# Patient Record
Sex: Male | Born: 1985 | Race: Black or African American | Hispanic: No | Marital: Single | Smoking: Never smoker
Health system: Southern US, Community
[De-identification: ages and names within clinical notes are randomized; demographics above are authoritative.]

---
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
## 2013-02-15 ENCOUNTER — Emergency Department (HOSPITAL_COMMUNITY)
Admission: EM | Admit: 2013-02-15 | Discharge: 2013-02-15 | Disposition: A | Discharge status: Home / Self Care | Payer: Self-pay | Attending: Emergency Medicine | Admitting: Emergency Medicine

## 2013-02-15 ENCOUNTER — Encounter (HOSPITAL_COMMUNITY): Payer: Self-pay | Admitting: Emergency Medicine

## 2013-02-15 DIAGNOSIS — Y9289 Other specified places as the place of occurrence of the external cause: Secondary | ICD-10-CM | POA: Insufficient documentation

## 2013-02-15 DIAGNOSIS — S61409A Unspecified open wound of unspecified hand, initial encounter: Secondary | ICD-10-CM | POA: Insufficient documentation

## 2013-02-15 DIAGNOSIS — S61411A Laceration without foreign body of right hand, initial encounter: Secondary | ICD-10-CM

## 2013-02-15 DIAGNOSIS — Y990 Civilian activity done for income or pay: Secondary | ICD-10-CM | POA: Insufficient documentation

## 2013-02-15 DIAGNOSIS — Y9389 Activity, other specified: Secondary | ICD-10-CM | POA: Insufficient documentation

## 2013-02-15 DIAGNOSIS — Z791 Long term (current) use of non-steroidal anti-inflammatories (NSAID): Secondary | ICD-10-CM | POA: Insufficient documentation

## 2013-02-15 DIAGNOSIS — W268XXA Contact with other sharp object(s), not elsewhere classified, initial encounter: Secondary | ICD-10-CM | POA: Insufficient documentation

## 2013-02-15 MED ORDER — IBUPROFEN 800 MG PO TABS: 800.0000 mg | ORAL_TABLET | Freq: Three times a day (TID) | ORAL | Status: DC

## 2013-02-15 NOTE — ED Notes (Signed)
4x4s placed between skin laceration and rag patient came in with. Rag still wrapped out hand so pressure is continually applied.

## 2013-02-15 NOTE — ED Notes (Signed)
Pt c/o right hand laceration while at work; bleeding controlled

## 2013-02-15 NOTE — ED Provider Notes (Signed)
CSN: [id number]     Arrival date & time 02/15/13  1355 History  This chart was scribed for non-physician practitioner, Dierdre Forth, PA-C working with No att. providers found by Greggory Stallion, ED scribe. This patient was seen in room TR07C/TR07C and the patient's care was started at 3:13 PM.   Chief Complaint  Patient presents with  . Laceration   The history is provided by the patient. No language interpreter was used.   HPI Comments: Harold Dixon is a 27 y.o. male who presents to the Emergency Department complaining of an acute, persistent laceration to the palm of his his right hand that occurred around 1:45 PM today. Pt states he cut it on apiece of metal on a car while he was at work. He states he has sudden onset, mild right hand pain. Pt rates his pain 4/10. His last tetanus was in 2011.   History reviewed. No pertinent past medical history. History reviewed. No pertinent past surgical history. History reviewed. No pertinent family history. History  Substance Use Topics  . Smoking status: Never Smoker   . Smokeless tobacco: Not on file  . Alcohol Use: No    Review of Systems  Constitutional: Negative for fever.  Gastrointestinal: Negative for nausea and vomiting.  Skin: Positive for wound.  Allergic/Immunologic: Negative for immunocompromised state.  Neurological: Negative for weakness and numbness.  Hematological: Does not bruise/bleed easily.  Psychiatric/Behavioral: The patient is not nervous/anxious.     Allergies  Review of patient's allergies indicates no known allergies.  Home Medications   Current Outpatient Rx  Name  Route  Sig  Dispense  Refill  . ibuprofen (ADVIL,MOTRIN) 800 MG tablet   Oral   Take 1 tablet (800 mg total) by mouth 3 (three) times daily.   21 tablet   0     BP 133/90  Pulse 102  Temp(Src) 97.7 F (36.5 C) (Oral)  Resp 18  SpO2 97%  Physical Exam  Nursing note and vitals reviewed. Constitutional: He is oriented to  person, place, and time. He appears well-developed and well-nourished. No distress.  HENT:  Head: Normocephalic and atraumatic.  Eyes: Conjunctivae are normal. No scleral icterus.  Neck: Normal range of motion.  Cardiovascular: Normal rate, regular rhythm, normal heart sounds and intact distal pulses.   No murmur heard. Capillary refill < 3 sec  Pulmonary/Chest: Effort normal and breath sounds normal. No respiratory distress.  Musculoskeletal: Normal range of motion. He exhibits no edema.  ROM: Full ROM of all fingers and wrist.   Neurological: He is alert and oriented to person, place, and time.  Sensation: intact to dull and sharp Strength: 5/5 including resisted flexion and extension of the fingers and thumb.   Skin: Skin is warm and dry. He is not diaphoretic. No erythema.  9 cm laceration to palm of right hand.   Psychiatric: He has a normal mood and affect. His behavior is normal.    ED Course  Procedures (including critical care time)  DIAGNOSTIC STUDIES: Oxygen Saturation is 97% on RA, normal by my interpretation.    COORDINATION OF CARE: 3:14 PM-Discussed treatment plan which includes laceration repair with pt at bedside and pt agreed to plan.   LACERATION REPAIR PROCEDURE NOTE The patient's identification was confirmed and consent was obtained. This procedure was performed by Dierdre Forth, PA-C at 3:34 PM. Site: palm of right hand Sterile procedures observed Anesthetic used (type and amt): 10 mL 2% lidocaine without epi Suture type/size: Prolene 5-0 Length: 9  cm # of Sutures: 11 Technique: simple interrupted Complexity: simple Antibx ointment applied Tetanus UTD Site anesthetized, irrigated with NS, explored without evidence of foreign body, wound well approximated, site covered with dry, sterile dressing.  Patient tolerated procedure well without complications. Instructions for care discussed verbally and patient provided with additional written  instructions for homecare and f/u.  Labs Review Labs Reviewed - No data to display Imaging Review No results found.  EKG Interpretation   None       MDM   1. Laceration of right hand, initial encounter      Harold Dixon presents with hand laceration.  Tdap UTD.  Pressure irrigation performed. Laceration occurred < 8 hours prior to repair which was well tolerated. Pt has no co morbidities to effect normal wound healing. Discussed suture home care w pt and answered questions. Pt to f-u for wound check and suture removal in 7 days. Pt is hemodynamically stable w no complaints prior to dc. It has been determined that no acute conditions requiring further emergency intervention are present at this time. The patient/guardian have been advised of the diagnosis and plan. We have discussed signs and symptoms that warrant return to the ED, such as changes or worsening in symptoms.   Vital signs are stable at discharge.   BP 90/57  Pulse 72  Temp(Src) 97.7 F (36.5 C) (Oral)  Resp 18  SpO2 95%  Patient/guardian has voiced understanding and agreed to follow-up with the PCP or specialist.    I personally performed the services described in this documentation, which was scribed in my presence. The recorded information has been reviewed and is accurate.      Dahlia Client Ameisha Mcclellan, PA-C 02/15/13 1640

## 2013-02-15 NOTE — ED Provider Notes (Signed)
  Medical screening examination/treatment/procedure(s) were performed by non-physician practitioner and as supervising physician I was immediately available for consultation/collaboration.   Contina Strain, MD 02/15/13 2208 

## 2015-03-04 ENCOUNTER — Emergency Department
Admission: EM | Admit: 2015-03-04 | Discharge: 2015-03-04 | Disposition: A | Discharge status: Home / Self Care | Payer: Self-pay | Attending: Emergency Medicine | Admitting: Emergency Medicine

## 2015-03-04 ENCOUNTER — Encounter: Payer: Self-pay | Admitting: *Deleted

## 2015-03-04 ENCOUNTER — Emergency Department: Payer: Self-pay

## 2015-03-04 DIAGNOSIS — Y92410 Unspecified street and highway as the place of occurrence of the external cause: Secondary | ICD-10-CM | POA: Insufficient documentation

## 2015-03-04 DIAGNOSIS — Y9389 Activity, other specified: Secondary | ICD-10-CM | POA: Insufficient documentation

## 2015-03-04 DIAGNOSIS — S93401A Sprain of unspecified ligament of right ankle, initial encounter: Secondary | ICD-10-CM | POA: Insufficient documentation

## 2015-03-04 DIAGNOSIS — S39012A Strain of muscle, fascia and tendon of lower back, initial encounter: Secondary | ICD-10-CM | POA: Insufficient documentation

## 2015-03-04 DIAGNOSIS — Z791 Long term (current) use of non-steroidal anti-inflammatories (NSAID): Secondary | ICD-10-CM | POA: Insufficient documentation

## 2015-03-04 DIAGNOSIS — M62830 Muscle spasm of back: Secondary | ICD-10-CM | POA: Insufficient documentation

## 2015-03-04 DIAGNOSIS — M25571 Pain in right ankle and joints of right foot: Secondary | ICD-10-CM

## 2015-03-04 DIAGNOSIS — Y998 Other external cause status: Secondary | ICD-10-CM | POA: Insufficient documentation

## 2015-03-04 DIAGNOSIS — S40011A Contusion of right shoulder, initial encounter: Secondary | ICD-10-CM | POA: Insufficient documentation

## 2015-03-04 MED ORDER — CYCLOBENZAPRINE HCL 10 MG PO TABS: 10.0000 mg | ORAL_TABLET | Freq: Three times a day (TID) | ORAL | Status: DC | PRN

## 2015-03-04 MED ORDER — MELOXICAM 15 MG PO TABS: 15.0000 mg | ORAL_TABLET | Freq: Every day | ORAL | Status: DC
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
## 2015-03-04 NOTE — Discharge Instructions (Signed)
Ankle Pain Ankle pain is a common symptom. The bones, cartilage, tendons, and muscles of the ankle joint perform a lot of work each day. The ankle joint holds your body weight and allows you to move around. Ankle pain can occur on either side or back of 1 or both ankles. Ankle pain may be sharp and burning or dull and aching. There may be tenderness, stiffness, redness, or warmth around the ankle. The pain occurs more often when a person walks or puts pressure on the ankle. CAUSES  There are many reasons ankle pain can develop. It is important to work with your caregiver to identify the cause since many conditions can impact the bones, cartilage, muscles, and tendons. Causes for ankle pain include:  Injury, including a break (fracture), sprain, or strain often due to a fall, sports, or a high-impact activity.  Swelling (inflammation) of a tendon (tendonitis).  Achilles tendon rupture.  Ankle instability after repeated sprains and strains.  Poor foot alignment.  Pressure on a nerve (tarsal tunnel syndrome).  Arthritis in the ankle or the lining of the ankle.  Crystal formation in the ankle (gout or pseudogout). DIAGNOSIS  A diagnosis is based on your medical history, your symptoms, results of your physical exam, and results of diagnostic tests. Diagnostic tests may include X-ray exams or a computerized magnetic scan (magnetic resonance imaging, MRI). TREATMENT  Treatment will depend on the cause of your ankle pain and may include:  Keeping pressure off the ankle and limiting activities.  Using crutches or other walking support (a cane or brace).  Using rest, ice, compression, and elevation.  Participating in physical therapy or home exercises.  Wearing shoe inserts or special shoes.  Losing weight.  Taking medications to reduce pain or swelling or receiving an injection.  Undergoing surgery. HOME CARE INSTRUCTIONS   Only take over-the-counter or prescription medicines for  pain, discomfort, or fever as directed by your caregiver.  Put ice on the injured area.  Put ice in a plastic bag.  Place a towel between your skin and the bag.  Leave the ice on for 15-20 minutes at a time, 03-04 times a day.  Keep your leg raised (elevated) when possible to lessen swelling.  Avoid activities that cause ankle pain.  Follow specific exercises as directed by your caregiver.  Record how often you have ankle pain, the location of the pain, and what it feels like. This information may be helpful to you and your caregiver.  Ask your caregiver about returning to work or sports and whether you should drive.  Follow up with your caregiver for further examination, therapy, or testing as directed. SEEK MEDICAL CARE IF:   Pain or swelling continues or worsens beyond 1 week.  You have an oral temperature above 102 F (38.9 C).  You are feeling unwell or have chills.  You are having an increasingly difficult time with walking.  You have loss of sensation or other new symptoms.  You have questions or concerns. MAKE SURE YOU:   Understand these instructions.  Will watch your condition.  Will get help right away if you are not doing well or get worse.   This information is not intended to replace advice given to you by your health care provider. Make sure you discuss any questions you have with your health care provider.   Document Released: 10/05/2009 Document Revised: 07/10/2011 Document Reviewed: 11/17/2014 Elsevier Interactive Patient Education 2016 [employer]  Contusion A contusion is a deep bruise. Contusions  are the result of a blunt injury to tissues and muscle fibers under the skin. The injury causes bleeding under the skin. The skin overlying the contusion may turn blue, purple, or yellow. Minor injuries will give you a painless contusion, but more severe contusions may stay painful and swollen for a few weeks.  CAUSES  This condition is usually  caused by a blow, trauma, or direct force to an area of the body. SYMPTOMS  Symptoms of this condition include:  Swelling of the injured area.  Pain and tenderness in the injured area.  Discoloration. The area may have redness and then turn blue, purple, or yellow. DIAGNOSIS  This condition is diagnosed based on a physical exam and medical history. An X-ray, CT scan, or MRI may be needed to determine if there are any associated injuries, such as broken bones (fractures). TREATMENT  Specific treatment for this condition depends on what area of the body was injured. In general, the best treatment for a contusion is resting, icing, applying pressure to (compression), and elevating the injured area. This is often called the RICE strategy. Over-the-counter anti-inflammatory medicines may also be recommended for pain control.  HOME CARE INSTRUCTIONS   Rest the injured area.  If directed, apply ice to the injured area:  Put ice in a plastic bag.  Place a towel between your skin and the bag.  Leave the ice on for 20 minutes, 2-3 times per day.  If directed, apply light compression to the injured area using an elastic bandage. Make sure the bandage is not wrapped too tightly. Remove and reapply the bandage as directed by your health care provider.  If possible, raise (elevate) the injured area above the level of your heart while you are sitting or lying down.  Take over-the-counter and prescription medicines only as told by your health care provider. SEEK MEDICAL CARE IF:  Your symptoms do not improve after several days of treatment.  Your symptoms get worse.  You have difficulty moving the injured area. SEEK IMMEDIATE MEDICAL CARE IF:   You have severe pain.  You have numbness in a hand or foot.  Your hand or foot turns pale or cold.   This information is not intended to replace advice given to you by your health care provider. Make sure you discuss any questions you have with  your health care provider.   Document Released: 01/25/2005 Document Revised: 01/06/2015 Document Reviewed: 09/02/2014 Elsevier Interactive Patient Education 2016 [employer]  Stirrup Ankle Brace Stirrup ankle braces give support and help stabilize the ankle joint. They are rigid pieces of plastic or fiberglass that go up both sides of the lower leg with the bottom of the stirrup fitting comfortably under the bottom of the instep of the foot. It can be held on with Velcro straps or an elastic wrap. Stirrup ankle braces are used to support the ankle following mild or moderate sprains or strains, or fractures after cast removal.  They can be easily removed or adjusted if there is swelling. The rigid brace shells are designed to fit the ankle comfortably and provide the needed medial/lateral stabilization. This brace can be easily worn with most athletic shoes. The brace liner is usually made of a soft, comfortable gel-like material. This gel fits the ankle well without causing uncomfortable pressure points.  IMPORTANCE OF ANKLE BRACES:  The use of ankle bracing is effective in the prevention of ankle sprains.  In athletes, the use of ankle bracing will offer protection and  prevent further sprains.  Research shows that a complete rehabilitation program needs to be included with external bracing. This includes range of motion and ankle strengthening exercises. Your caregivers will instruct you in this. If you were given the brace today for a new injury, use the following home care instructions as a guide. HOME CARE INSTRUCTIONS   Apply ice to the sore area for 15-20 minutes, 03-04 times per day while awake for the first 2 days. Put the ice in a plastic bag and place a towel between the bag of ice and your skin. Never place the ice pack directly on your skin. Be especially careful using ice on an elbow or knee or other bony area, such as your ankle, because icing for too long may damage the nerves  which are close to the surface.  Keep your leg elevated when possible to lessen swelling.  Wear your splint until you are seen for a follow-up examination. Do not put weight on it. Do not get it wet. You may take it off to take a shower or bath.  For Activity: Use crutches with non-weight bearing for 1 week. Then, you may walk on your ankle as instructed. Start gradually with weight bearing on the affected ankle.  Continue to use crutches or a cane until you can stand on your ankle without causing pain.  Wiggle your toes in the splint several times per day if you are able.  The splint is too tight if you have numbness, tingling, or if your foot becomes cold and blue. Adjust the straps or elastic bandage to make it comfortable.  Only take over-the-counter or prescription medicines for pain, discomfort, or fever as directed by your caregiver. SEEK IMMEDIATE MEDICAL CARE IF:   You have increased bruising, swelling or pain.  Your toes are blue or cold and loosening the brace or wrap does not help.  Your pain is not relieved with medicine. MAKE SURE YOU:   Understand these instructions.  Will watch your condition.  Will get help right away if you are not doing well or get worse.   This information is not intended to replace advice given to you by your health care provider. Make sure you discuss any questions you have with your health care provider.   Document Released: 02/16/2004 Document Revised: 07/10/2011 Document Reviewed: 11/17/2014 Elsevier Interactive Patient Education 2016 [employer]  Muscle Strain A muscle strain is an injury that occurs when a muscle is stretched beyond its normal length. Usually a small number of muscle fibers are torn when this happens. Muscle strain is rated in degrees. First-degree strains have the least amount of muscle fiber tearing and pain. Second-degree and third-degree strains have increasingly more tearing and pain.  Usually, recovery from  muscle strain takes 1-2 weeks. Complete healing takes 5-6 weeks.  CAUSES  Muscle strain happens when a sudden, violent force placed on a muscle stretches it too far. This may occur with lifting, sports, or a fall.  RISK FACTORS Muscle strain is especially common in athletes.  SIGNS AND SYMPTOMS At the site of the muscle strain, there may be:  Pain.  Bruising.  Swelling.  Difficulty using the muscle due to pain or lack of normal function. DIAGNOSIS  Your health care provider will perform a physical exam and ask about your medical history. TREATMENT  Often, the best treatment for a muscle strain is resting, icing, and applying cold compresses to the injured area.  HOME CARE INSTRUCTIONS   Use the  PRICE method of treatment to promote muscle healing during the first 2-3 days after your injury. The PRICE method involves:  Protecting the muscle from being injured again.  Restricting your activity and resting the injured body part.  Icing your injury. To do this, put ice in a plastic bag. Place a towel between your skin and the bag. Then, apply the ice and leave it on from 15-20 minutes each hour. After the third day, switch to moist heat packs.  Apply compression to the injured area with a splint or elastic bandage. Be careful not to wrap it too tightly. This may interfere with blood circulation or increase swelling.  Elevate the injured body part above the level of your heart as often as you can.  Only take over-the-counter or prescription medicines for pain, discomfort, or fever as directed by your health care provider.  Warming up prior to exercise helps to prevent future muscle strains. SEEK MEDICAL CARE IF:   You have increasing pain or swelling in the injured area.  You have numbness, tingling, or a significant loss of strength in the injured area. MAKE SURE YOU:   Understand these instructions.  Will watch your condition.  Will get help right away if you are not doing  well or get worse.   This information is not intended to replace advice given to you by your health care provider. Make sure you discuss any questions you have with your health care provider.   Document Released: 04/17/2005 Document Revised: 02/05/2013 Document Reviewed: 11/14/2012 Elsevier Interactive Patient Education [employer] Else[employer] Inc.

## 2018-10-02 ENCOUNTER — Encounter (HOSPITAL_COMMUNITY): Payer: Self-pay

## 2018-10-02 ENCOUNTER — Other Ambulatory Visit: Payer: Self-pay

## 2018-10-02 ENCOUNTER — Inpatient Hospital Stay (HOSPITAL_COMMUNITY)
Admission: EM | Admit: 2018-10-02 | Discharge: 2018-10-03 | DRG: 178 | Disposition: A | Discharge status: Home / Self Care | Payer: HRSA Program | Attending: Internal Medicine | Admitting: Internal Medicine

## 2018-10-02 ENCOUNTER — Emergency Department (HOSPITAL_COMMUNITY): Payer: HRSA Program

## 2018-10-02 DIAGNOSIS — J989 Respiratory disorder, unspecified: Secondary | ICD-10-CM

## 2018-10-02 DIAGNOSIS — E860 Dehydration: Secondary | ICD-10-CM | POA: Diagnosis present

## 2018-10-02 DIAGNOSIS — U071 COVID-19: Principal | ICD-10-CM | POA: Diagnosis present

## 2018-10-02 DIAGNOSIS — N179 Acute kidney failure, unspecified: Secondary | ICD-10-CM | POA: Diagnosis present

## 2018-10-02 DIAGNOSIS — R509 Fever, unspecified: Secondary | ICD-10-CM

## 2018-10-02 DIAGNOSIS — D6959 Other secondary thrombocytopenia: Secondary | ICD-10-CM | POA: Diagnosis not present

## 2018-10-02 DIAGNOSIS — Z79899 Other long term (current) drug therapy: Secondary | ICD-10-CM

## 2018-10-02 DIAGNOSIS — I959 Hypotension, unspecified: Secondary | ICD-10-CM | POA: Diagnosis present

## 2018-10-02 DIAGNOSIS — R001 Bradycardia, unspecified: Secondary | ICD-10-CM | POA: Diagnosis not present

## 2018-10-02 LAB — CBC WITH DIFFERENTIAL/PLATELET
Abs Immature Granulocytes: 0.01 10*3/uL (ref 0.00–0.07)
Basophils Absolute: 0 10*3/uL (ref 0.0–0.1)
Basophils Relative: 0 %
Eosinophils Absolute: 0 10*3/uL (ref 0.0–0.5)
Eosinophils Relative: 0 %
HCT: 46.7 % (ref 39.0–52.0)
Hemoglobin: 16.2 g/dL (ref 13.0–17.0)
Immature Granulocytes: 0 %
Lymphocytes Relative: 12 %
Lymphs Abs: 0.8 10*3/uL (ref 0.7–4.0)
MCH: 31.2 pg (ref 26.0–34.0)
MCHC: 34.7 g/dL (ref 30.0–36.0)
MCV: 89.8 fL (ref 80.0–100.0)
Monocytes Absolute: 0.5 10*3/uL (ref 0.1–1.0)
Monocytes Relative: 7 %
Neutro Abs: 5.5 10*3/uL (ref 1.7–7.7)
Neutrophils Relative %: 81 %
Platelets: 119 10*3/uL — ABNORMAL LOW (ref 150–400)
RBC: 5.2 MIL/uL (ref 4.22–5.81)
RDW: 13.1 % (ref 11.5–15.5)
WBC: 6.8 10*3/uL (ref 4.0–10.5)
nRBC: 0 % (ref 0.0–0.2)

## 2018-10-02 LAB — BASIC METABOLIC PANEL
Anion gap: 12 (ref 5–15)
BUN: 14 mg/dL (ref 6–20)
CO2: 27 mmol/L (ref 22–32)
Calcium: 9.7 mg/dL (ref 8.9–10.3)
Chloride: 99 mmol/L (ref 98–111)
Creatinine, Ser: 1.73 mg/dL — ABNORMAL HIGH (ref 0.61–1.24)
GFR calc Af Amer: 59 mL/min — ABNORMAL LOW (ref >60)
GFR calc non Af Amer: 51 mL/min — ABNORMAL LOW (ref >60)
Glucose, Bld: 119 mg/dL — ABNORMAL HIGH (ref 70–99)
Potassium: 4.5 mmol/L (ref 3.5–5.1)
Sodium: 138 mmol/L (ref 135–145)

## 2018-10-02 LAB — SARS CORONAVIRUS 2 BY RT PCR (HOSPITAL ORDER, PERFORMED IN ~~LOC~~ HOSPITAL LAB): SARS Coronavirus 2: POSITIVE — AB

## 2018-10-02 MED ORDER — KETOROLAC TROMETHAMINE 60 MG/2ML IM SOLN
30.0000 mg | Freq: Once | INTRAMUSCULAR | Status: AC
  Administered 2018-10-02: 30 mg via INTRAMUSCULAR
  Filled 2018-10-02: qty 2

## 2018-10-02 MED ORDER — BENZONATATE 100 MG PO CAPS: 100.0000 mg | ORAL_CAPSULE | Freq: Three times a day (TID) | ORAL | 0 refills | Status: AC

## 2018-10-02 NOTE — ED Notes (Signed)
Pt not having ear pain

## 2018-10-02 NOTE — ED Provider Notes (Signed)
[facility] EMERGENCY DEPARTMENT Provider Note   CSN: [id number] Arrival date & time: 10/02/18  1717    History   Chief Complaint No chief complaint on file.   HPI Harold Dixon is a 33 y.o. male.     Patient presents today with history of intermittent fever and chills for 5 days. Fever has been responsive to antipyretics. Mild non-productive cough onset today, along with occasional nausea. One episode of emesis after arriving in ED. No known sick contacts. Denies shortness of breath, abdominal pain, diarrhea.  The history is provided by the patient. No language interpreter was used.  Fever  Max temp prior to arrival:  102 Onset quality:  Sudden Duration:  5 days Timing:  Intermittent Progression:  Waxing and waning Chronicity:  New Associated symptoms: chills, cough, myalgias, nausea and vomiting   Associated symptoms: no chest pain, no diarrhea and no headaches     History reviewed. No pertinent past medical history.  There are no active problems to display for this patient.   History reviewed. No pertinent surgical history.      Home Medications    Prior to Admission medications   Medication Sig Start Date End Date Taking? Authorizing Provider  cyclobenzaprine (FLEXERIL) 10 MG tablet Take 1 tablet (10 mg total) by mouth 3 (three) times daily as needed for muscle spasms. 03/04/15   Cuthriell, Delorise Royals, PA-C  ibuprofen (ADVIL,MOTRIN) 800 MG tablet Take 1 tablet (800 mg total) by mouth 3 (three) times daily. 02/15/13   Muthersbaugh, Dahlia Client, PA-C  meloxicam (MOBIC) 15 MG tablet Take 1 tablet (15 mg total) by mouth daily. 03/04/15   Cuthriell, Delorise Royals, PA-C    Family History History reviewed. No pertinent family history.  Social History Social History   Tobacco Use  . Smoking status: Never Smoker  Substance Use Topics  . Alcohol use: No  . Drug use: No     Allergies   Patient has no known allergies.   Review of Systems  Review of Systems  Constitutional: Positive for chills and fever.  Respiratory: Positive for cough. Negative for shortness of breath.   Cardiovascular: Negative for chest pain.  Gastrointestinal: Positive for nausea and vomiting. Negative for abdominal pain and diarrhea.  Musculoskeletal: Positive for myalgias.  Neurological: Negative for headaches.  All other systems reviewed and are negative.    Physical Exam Updated Vital Signs BP 122/70 (BP Location: Right Arm)   Pulse (!) 102   Temp (!) 102.8 F (39.3 C) (Oral)   Resp 18   SpO2 96%   Physical Exam Vitals signs and nursing note reviewed.  Constitutional:      General: He is not in acute distress. HENT:     Head: Normocephalic.     Mouth/Throat:     Mouth: Mucous membranes are moist.  Eyes:     Conjunctiva/sclera: Conjunctivae normal.  Neck:     Musculoskeletal: Neck supple.  Cardiovascular:     Rate and Rhythm: Normal rate and regular rhythm.  Pulmonary:     Effort: Pulmonary effort is normal.     Breath sounds: Normal breath sounds.  Abdominal:     Palpations: Abdomen is soft.  Musculoskeletal: Normal range of motion.  Skin:    General: Skin is warm and dry.  Neurological:     Mental Status: He is alert and oriented to person, place, and time.  Psychiatric:        Mood and Affect: Mood normal.  ED Treatments / Results  Labs (all labs ordered are listed, but only abnormal results are displayed) Labs Reviewed  SARS CORONAVIRUS 2 (HOSPITAL ORDER, PERFORMED IN [facility]) - Abnormal; Notable for the following components:      Result Value   SARS Coronavirus 2 POSITIVE (*)    All other components within normal limits  CBC WITH DIFFERENTIAL/PLATELET - Abnormal; Notable for the following components:   Platelets 119 (*)    All other components within normal limits  BASIC METABOLIC PANEL - Abnormal; Notable for the following components:   Glucose, Bld 119 (*)    Creatinine, Ser 1.73 (*)     GFR calc non Af Amer 51 (*)    GFR calc Af Amer 59 (*)    All other components within normal limits    EKG None  Radiology Dg Chest Portable 1 View  Result Date: 10/02/2018 CLINICAL DATA:  Fever and nausea for 5 days. EXAM: PORTABLE CHEST 1 VIEW COMPARISON:  None. FINDINGS: The cardiac silhouette, mediastinal and hilar contours are within normal limits given the AP projection and portable technique. Low lung volumes with vascular crowding and streaky basilar atelectasis. No definite infiltrates or effusions. The bony thorax is intact. IMPRESSION: No acute cardiopulmonary findings. Electronically Signed   By: Rudie MeyerP.  Gallerani M.D.   On: 10/02/2018 20:10    Procedures Procedures (including critical care time)  Medications Ordered in ED Medications  ketorolac (TORADOL) injection 30 mg (30 mg Intramuscular Given 10/02/18 1925)     Initial Impression / Assessment and Plan / ED Course  I have reviewed the triage vital signs and the nursing notes.  Pertinent labs & imaging results that were available during my care of the patient were reviewed by me and considered in my medical decision making (see chart for details).       Harold Dixon was evaluated in Emergency Department on 10/02/2018 for the symptoms described in the history of present illness. He was evaluated in the context of the global COVID-19 pandemic, which necessitated consideration that the patient might be at risk for infection with the SARS-CoV-2 virus that causes COVID-19. Institutional protocols and algorithms that pertain to the evaluation of patients at risk for COVID-19 are in a state of rapid change based on information released by regulatory bodies including the CDC and federal and state organizations. These policies and algorithms were followed during the patient's care in the ED.  Patient's labs and radiology results reviewed. He is COVID positive. He was initially tachycardic with the fever. No tachypnea. Oxygen  saturations in upper 90's. Normal CXR.  Initial plan was to discharge home with self quarantine and symptomatic treatment. However, at time of discharge, patient is reporting dizziness, and is found to be hypotensive with pulse rate in the 40's. Fluid bolus initiated, with pulse now in the 70's and blood pressure improving to 97/58. Discussed with attending. Will request admission.  Final Clinical Impressions(s) / ED Diagnoses   Final diagnoses:  COVID-19 virus detected  Febrile respiratory illness    ED Discharge Orders    None       Felicie MornSmith, Jerrett Baldinger, NP 10/02/18 2250    Pricilla LovelessGoldston, Scott, MD 10/10/18 [phone]

## 2018-10-02 NOTE — ED Triage Notes (Signed)
Onset 5 days fever, chills in the evenings/early morning, during day pt is afebrile.  Pt just started with mild cough.  No one in household sick. No known contact with Covid.  No respiratory or swallowing difficulties.

## 2018-10-02 NOTE — H&P (Signed)
History and Physical   Harold Dixon ZOX:[id number] DOB: 1986-04-29 DOA: 10/02/2018  Referring MD/NP/PA: Dr Lawrence MarseillesGoldstone  PCP: Patient, No Pcp Per   Outpatient Specialists: None   Patient coming from: Home  Chief Complaint: Fever, chills  HPI: Harold Dixon is a 33 y.o. male with medical history significant of low back pain with no known sick contacts who presented to the ER with 5 days of fever and chills and has been taking meloxicam and ibuprofen.  Patient has also been on cyclobenzaprine.  He denied any significant shortness of breath but has been having nonproductive cough that started today.  He has been feeling malaise and generalized weakness all week.  Patient was seen in the ER and evaluated.  He turned out to be positive for COVID-19.  Patient also noted to have sudden bradycardia as well as tachycardia back-and-forth.  His blood pressure was 60 systolic in the outset.  He still remained systolic in the 80s to 90s after fluid boluses.  Patient is being admitted for this reason due to hypotension.  He is not hypoxic but has a temperature of 103..  ED Course: Temperature is 102.8 with initial blood pressure 61/33 pulse 102 but went down to 43 respiratory rate of 18 oxygen sat 94% room air.  CBC showed platelets of 119 chemistry is normal except for creatinine of 1.73 and glucose 119.   Review of Systems: As per HPI otherwise 10 point review of systems negative.    History reviewed. No pertinent past medical history.  History reviewed. No pertinent surgical history.   reports that he has never smoked. He does not have any smokeless tobacco history on file. He reports that he does not drink alcohol or use drugs.  No Known Allergies  History reviewed. No pertinent family history.   Prior to Admission medications   Medication Sig Start Date End Date Taking? Authorizing Provider  benzonatate (TESSALON) 100 MG capsule Take 1 capsule (100 mg total) by mouth every 8 (eight)  hours. 10/02/18   Felicie MornSmith, David, NP  cyclobenzaprine (FLEXERIL) 10 MG tablet Take 1 tablet (10 mg total) by mouth 3 (three) times daily as needed for muscle spasms. 03/04/15   Cuthriell, Delorise RoyalsJonathan D, PA-C  ibuprofen (ADVIL,MOTRIN) 800 MG tablet Take 1 tablet (800 mg total) by mouth 3 (three) times daily. 02/15/13   Muthersbaugh, Dahlia ClientHannah, PA-C  meloxicam (MOBIC) 15 MG tablet Take 1 tablet (15 mg total) by mouth daily. 03/04/15   Racheal Patchesuthriell, Jonathan D, PA-C    Physical Exam: Vitals:   10/02/18 2226 10/02/18 2230 10/02/18 2245 10/02/18 2300  BP: (!) 83/49 (!) 97/58 119/70 119/60  Pulse: (!) 58 70 77 76  Resp:    18  Temp:      TempSrc:      SpO2: 100% 96% 94% 96%      Constitutional: NAD, calm, anxious Vitals:   10/02/18 2226 10/02/18 2230 10/02/18 2245 10/02/18 2300  BP: (!) 83/49 (!) 97/58 119/70 119/60  Pulse: (!) 58 70 77 76  Resp:    18  Temp:      TempSrc:      SpO2: 100% 96% 94% 96%   Eyes: PERRL, lids and conjunctivae normal ENMT: Mucous membranes are moist. Posterior pharynx clear of any exudate or lesions.Normal dentition.  Neck: normal, supple, no masses, no thyromegaly Respiratory: clear to auscultation bilaterally, no wheezing, no crackles. Normal respiratory effort. No accessory muscle use.  Cardiovascular: Tachycardia,, no murmurs / rubs / gallops. No extremity edema. 2+  pedal pulses. No carotid bruits.  Abdomen: no tenderness, no masses palpated. No hepatosplenomegaly. Bowel sounds positive.  Musculoskeletal: no clubbing / cyanosis. No joint deformity upper and lower extremities. Good ROM, no contractures. Normal muscle tone.  Skin: no rashes, lesions, ulcers. No induration Neurologic: CN 2-12 grossly intact. Sensation intact, DTR normal. Strength 5/5 in all 4.  Psychiatric: Normal judgment and insight. Alert and oriented x 3. Normal mood.     Labs on Admission: I have personally reviewed following labs and imaging studies  CBC: Recent Labs  Lab 10/02/18 1912   WBC 6.8  NEUTROABS 5.5  HGB 16.2  HCT 46.7  MCV 89.8  PLT 119*   Basic Metabolic Panel: Recent Labs  Lab 10/02/18 1912  NA 138  K 4.5  CL 99  CO2 27  GLUCOSE 119*  BUN 14  CREATININE 1.73*  CALCIUM 9.7   GFR: CrCl cannot be calculated (Unknown ideal weight.). Liver Function Tests: No results for input(s): AST, ALT, ALKPHOS, BILITOT, PROT, ALBUMIN in the last 168 hours. No results for input(s): LIPASE, AMYLASE in the last 168 hours. No results for input(s): AMMONIA in the last 168 hours. Coagulation Profile: No results for input(s): INR, PROTIME in the last 168 hours. Cardiac Enzymes: No results for input(s): CKTOTAL, CKMB, CKMBINDEX, TROPONINI in the last 168 hours. BNP (last 3 results) No results for input(s): PROBNP in the last 8760 hours. HbA1C: No results for input(s): HGBA1C in the last 72 hours. CBG: No results for input(s): GLUCAP in the last 168 hours. Lipid Profile: No results for input(s): CHOL, HDL, LDLCALC, TRIG, CHOLHDL, LDLDIRECT in the last 72 hours. Thyroid Function Tests: No results for input(s): TSH, T4TOTAL, FREET4, T3FREE, THYROIDAB in the last 72 hours. Anemia Panel: No results for input(s): VITAMINB12, FOLATE, FERRITIN, TIBC, IRON, RETICCTPCT in the last 72 hours. Urine analysis: No results found for: COLORURINE, APPEARANCEUR, LABSPEC, PHURINE, GLUCOSEU, HGBUR, BILIRUBINUR, KETONESUR, PROTEINUR, UROBILINOGEN, NITRITE, LEUKOCYTESUR Sepsis Labs: (procalcitonin:4,lacticidven:4) ) Recent Results (from the past 240 hour(s))  SARS Coronavirus 2 (CEPHEID- Performed in [facility] hospital lab), Hosp Order     Status: Abnormal   Collection Time: 10/02/18  7:22 PM  Result Value Ref Range Status   SARS Coronavirus 2 POSITIVE (A) NEGATIVE Final    Comment: RESULT CALLED TO, READ BACK BY AND VERIFIED WITH: E PREYER RN 10/02/18 2101 JDW (NOTE) If result is NEGATIVE SARS-CoV-2 target nucleic acids are NOT DETECTED. The SARS-CoV-2 RNA is  generally detectable in upper and lower  respiratory specimens during the acute phase of infection. The lowest  concentration of SARS-CoV-2 viral copies this assay can detect is 250  copies / mL. A negative result does not preclude SARS-CoV-2 infection  and should not be used as the sole basis for treatment or other  patient management decisions.  A negative result may occur with  improper specimen collection / handling, submission of specimen other  than nasopharyngeal swab, presence of viral mutation(s) within the  areas targeted by this assay, and inadequate number of viral copies  (<250 copies / mL). A negative result must be combined with clinical  observations, patient history, and epidemiological information. If result is POSITIVE SARS-CoV-2 target nucleic acids are DETECTED. The SARS- CoV-2 RNA is generally detectable in upper and lower  respiratory specimens during the acute phase of infection.  Positive  results are indicative of active infection with SARS-CoV-2.  Clinical  correlation with patient history and other diagnostic information is  necessary to determine patient infection status.  Positive results do  not rule out bacterial infection or co-infection with other viruses. If result is PRESUMPTIVE POSTIVE SARS-CoV-2 nucleic acids MAY BE PRESENT.   A presumptive positive result was obtained on the submitted specimen  and confirmed on repeat testing.  While 2019 novel coronavirus  (SARS-CoV-2) nucleic acids may be present in the submitted sample  additional confirmatory testing may be necessary for epidemiological  and / or clinical management purposes  to differentiate between  SARS-CoV-2 and other Sarbecovirus currently known to infect humans.  If clinically indicated additional testing with an alternate test  methodology [phone]) is advised . The SARS-CoV-2 RNA is generally  detectable in upper and lower respiratory specimens during the acute  phase of infection.  The expected result is Negative. Fact Sheet for Patients:  BoilerBrush.com.cy Fact Sheet for Healthcare Providers: https://pope.com/ This test is not yet approved or cleared by the [place] FDA and has been authorized for detection and/or diagnosis of SARS-CoV-2 by FDA under an Emergency Use Authorization (EUA).  This EUA will remain in effect (meaning this test can be used) for the duration of the COVID-19 declaration under Section 564(b)(1) of the Act, 21 U.S.C. section 360bbb-3(b)(1), unless the authorization is terminated or revoked sooner. Performed at [facility], [address], [place], [place] [address]      Radiological Exams on Admission: Dg Chest Portable 1 View  Result Date: 10/02/2018 CLINICAL DATA:  Fever and nausea for 5 days. EXAM: PORTABLE CHEST 1 VIEW COMPARISON:  None. FINDINGS: The cardiac silhouette, mediastinal and hilar contours are within normal limits given the AP projection and portable technique. Low lung volumes with vascular crowding and streaky basilar atelectasis. No definite infiltrates or effusions. The bony thorax is intact. IMPRESSION: No acute cardiopulmonary findings. Electronically Signed   By: Rudie Meyer M.D.   On: 10/02/2018 20:10      Assessment/Plan Principal Problem:   COVID-19 virus infection Active Problems:   Hypotension   Dehydration   Bradycardia   ARF (acute renal failure) (HCC)     #1 COVID-19 infection: So far no hypoxia and no pulmonary infiltrates.  Supportive care only.  #2 hypotension with bradycardia: Cause is unknown.  Patient will be observed in the hospital.  Aggressive hydration and monitoring blood pressure.  It could be early sepsis.  #3 dehydration: Probably due to acute viral illness.  Hydrate patient aggressively.  #4 acute kidney injury: Probably combination of dehydration with use of NSAIDs.  Avoiding NSAIDs especially with the COVID-19 infection.   Hydrate and monitor renal function.  #5 thrombocytopenia: Secondary to COVID-19 infection most likely.   DVT prophylaxis: Lovenox Code Status: Full code Family Communication: Discussed care with patient Disposition Plan: Home Consults called: None Admission status: Observation  Severity of Illness: The appropriate patient status for this patient is OBSERVATION. Observation status is judged to be reasonable and necessary in order to provide the required intensity of service to ensure the patient's safety. The patient's presenting symptoms, physical exam findings, and initial radiographic and laboratory data in the context of their medical condition is felt to place them at decreased risk for further clinical deterioration. Furthermore, it is anticipated that the patient will be medically stable for discharge from the hospital within 2 midnights of admission. The following factors support the patient status of observation.   " The patient's presenting symptoms include fever with dehydration. " The physical exam findings include sinus tachycardia. " The initial radiographic and laboratory data are positive for COVID-19.  Lonia Blood MD [facility] Pager [phone]  If 7PM-7AM, please contact night-coverage [email or web address] Password [email or web address]  10/02/2018, 11:15 PM

## 2018-10-02 NOTE — ED Notes (Addendum)
Iv fluid bolus started

## 2018-10-02 NOTE — ED Notes (Signed)
Gave

## 2018-10-02 NOTE — ED Notes (Addendum)
ED TO INPATIENT HANDOFF REPORT  ED Nurse Name and Phone #:  Inetta Fermo, RN @ [phone]  S Name/Age/Gender Simeon Craft 33 y.o. male Room/Bed: 015C/015C  Code Status   Code Status: Not on file  Home/SNF/Other Home Patient oriented to: self, place, time and situation Is this baseline? Yes   Triage Complete: Triage complete  Chief Complaint fever  Triage Note Onset 5 days fever, chills in the evenings/early morning, during day pt is afebrile.  Pt just started with mild cough.  No one in household sick. No known contact with Covid.  No respiratory or swallowing difficulties.    Allergies No Known Allergies  Level of Care/Admitting Diagnosis ED Disposition    ED Disposition Condition Comment   Admit  Hospital Area[facility] [[id number]]  Level of Care: Telemetry [5]  Covid Evaluation: Confirmed COVID Positive  Isolation Risk Level: Low Risk/Droplet (Less than 4L V[place]ell supplementation)  Diagnosis: COVID-19 virus infection [[id number]]  Admitting Physician: Rometta Emery [[id number]]  Attending Physician: Rometta Emery [[id number]]  Estimated length of stay: past midnight tomorrow  Certification:: I certify this patient will need inpatient services for at least 2 midnights  PT Class (Do Not Modify): Inpatient [101]  PT Acc Code (Do Not Modify): Private [1]       B Medical/Surgery History History reviewed. No pertinent past medical history. History reviewed. No pertinent surgical history.   A IV Location/Drains/Wounds Patient Lines/Drains/Airways Status   Active Line/Drains/Airways    Name:   Placement date:   Placement time:   Site:   Days:   Peripheral IV 10/02/18 Left Antecubital   10/02/18    2225    Antecubital   less than 1   Wound 02/15/13 Laceration Hand Right   02/15/13    1508    Hand   2055          Intake/Output Last 24 hours No intake or output data in the 24 hours ending 10/02/18 2350  Labs/Imaging Results for orders placed or  performed during the hospital encounter of 10/02/18 (from the past 48 hour(s))  CBC with Differential/Platelet     Status: Abnormal   Collection Time: 10/02/18  7:12 PM  Result Value Ref Range   WBC 6.8 4.0 - 10.5 K/uL   RBC 5.20 4.22 - 5.81 MIL/uL   Hemoglobin 16.2 13.0 - 17.0 g/dL   HCT 28.4 13.2 - 44.0 %   MCV 89.8 80.0 - 100.0 fL   MCH 31.2 26.0 - 34.0 pg   MCHC 34.7 30.0 - 36.0 g/dL   RDW 10.2 72.5 - 36.6 %   Platelets 119 (L) 150 - 400 K/uL    Comment: REPEATED TO VERIFY PLATELET COUNT CONFIRMED BY SMEAR SPECIMEN CHECKED FOR CLOTS Immature Platelet Fraction may be clinically indicated, consider ordering this additional test [id number]    nRBC 0.0 0.0 - 0.2 %   Neutrophils Relative % 81 %   Neutro Abs 5.5 1.7 - 7.7 K/uL   Lymphocytes Relative 12 %   Lymphs Abs 0.8 0.7 - 4.0 K/uL   Monocytes Relative 7 %   Monocytes Absolute 0.5 0.1 - 1.0 K/uL   Eosinophils Relative 0 %   Eosinophils Absolute 0.0 0.0 - 0.5 K/uL   Basophils Relative 0 %   Basophils Absolute 0.0 0.0 - 0.1 K/uL   Immature Granulocytes 0 %   Abs Immature Granulocytes 0.01 0.00 - 0.07 K/uL    Comment: Performed at [facility], [address] [place], [place] [address]  Basic metabolic panel     Status: Abnormal   Collection Time: 10/02/18  7:12 PM  Result Value Ref Range   Sodium 138 135 - 145 mmol/L   Potassium 4.5 3.5 - 5.1 mmol/L   Chloride 99 98 - 111 mmol/L   CO2 27 22 - 32 mmol/L   Glucose, Bld 119 (H) 70 - 99 mg/dL   BUN 14 6 - 20 mg/dL   Creatinine, Ser 6.041.73 (H) 0.61 - 1.24 mg/dL   Calcium 9.7 8.9 - 54.010.3 mg/dL   GFR calc non Af Amer 51 (L) >60 mL/min   GFR calc Af Amer 59 (L) >60 mL/min   Anion gap 12 5 - 15    Comment: Performed at [facility], [address] [place], [place] [address]  SARS Coronavirus 2 (CEPHEID- Performed in [facility] hospital lab), Hosp Order     Status: Abnormal   Collection Time: 10/02/18  7:22 PM  Result Value Ref Range   SARS Coronavirus 2 POSITIVE  (A) NEGATIVE    Comment: RESULT CALLED TO, READ BACK BY AND VERIFIED WITH: E PREYER RN 10/02/18 2101 JDW (NOTE) If result is NEGATIVE SARS-CoV-2 target nucleic acids are NOT DETECTED. The SARS-CoV-2 RNA is generally detectable in upper and lower  respiratory specimens during the acute phase of infection. The lowest  concentration of SARS-CoV-2 viral copies this assay can detect is 250  copies / mL. A negative result does not preclude SARS-CoV-2 infection  and should not be used as the sole basis for treatment or other  patient management decisions.  A negative result may occur with  improper specimen collection / handling, submission of specimen other  than nasopharyngeal swab, presence of viral mutation(s) within the  areas targeted by this assay, and inadequate number of viral copies  (<250 copies / mL). A negative result must be combined with clinical  observations, patient history, and epidemiological information. If result is POSITIVE SARS-CoV-2 target nucleic acids are DETECTED. The SARS- CoV-2 RNA is generally detectable in upper and lower  respiratory specimens during the acute phase of infection.  Positive  results are indicative of active infection with SARS-CoV-2.  Clinical  correlation with patient history and other diagnostic information is  necessary to determine patient infection status.  Positive results do  not rule out bacterial infection or co-infection with other viruses. If result is PRESUMPTIVE POSTIVE SARS-CoV-2 nucleic acids MAY BE PRESENT.   A presumptive positive result was obtained on the submitted specimen  and confirmed on repeat testing.  While 2019 novel coronavirus  (SARS-CoV-2) nucleic acids may be present in the submitted sample  additional confirmatory testing may be necessary for epidemiological  and / or clinical management purposes  to differentiate between  SARS-CoV-2 and other Sarbecovirus currently known to infect humans.  If clinically  indicated additional testing with an alternate test  methodology [phone]([id number]) is advised . The SARS-CoV-2 RNA is generally  detectable in upper and lower respiratory specimens during the acute  phase of infection. The expected result is Negative. Fact Sheet for Patients:  BoilerBrush.com.cyhttps://www.fda.gov/media/136312/download Fact Sheet for Healthcare Providers: https://pope.com/https://www.fda.gov/media/136313/download This test is not yet approved or cleared by the [place] FDA and has been authorized for detection and/or diagnosis of SARS-CoV-2 by FDA under an Emergency Use Authorization (EUA).  This EUA will remain in effect (meaning this test can be used) for the duration of the COVID-19 declaration under Section 564(b)(1) of the Act, 21 U.S.C. section 360bbb-3(b)(1),  unless the authorization is terminated or revoked sooner. Performed at [facility], [address], [place], [place] [address]    Dg Chest Portable 1 View  Result Date: 10/02/2018 CLINICAL DATA:  Fever and nausea for 5 days. EXAM: PORTABLE CHEST 1 VIEW COMPARISON:  None. FINDINGS: The cardiac silhouette, mediastinal and hilar contours are within normal limits given the AP projection and portable technique. Low lung volumes with vascular crowding and streaky basilar atelectasis. No definite infiltrates or effusions. The bony thorax is intact. IMPRESSION: No acute cardiopulmonary findings. Electronically Signed   By: Rudie Meyer M.D.   On: 10/02/2018 20:10    Pending Labs [employer] (From admission, onward)    Start     Ordered   Signed and Held  HIV antibody (Routine Testing)  Once,   R     Signed and Held   Signed and Held  ABO/Rh  Once,   R     Signed and Held   Signed and Held  CBC  (enoxaparin (LOVENOX)    CrCl >/= 30 ml/min)  Once,   R    Comments:  Baseline for enoxaparin therapy IF NOT ALREADY DRAWN.  Notify MD if PLT < 100 K.    Signed and Held   Signed and Held  Creatinine, serum  (enoxaparin (LOVENOX)    CrCl >/= 30  ml/min)  Once,   R    Comments:  Baseline for enoxaparin therapy IF NOT ALREADY DRAWN.    Signed and Held   Signed and Held  Creatinine, serum  (enoxaparin (LOVENOX)    CrCl >/= 30 ml/min)  Weekly,   R    Comments:  while on enoxaparin therapy    Signed and Held   Signed and Held  CBC with Differential/Platelet  Daily,   R     Signed and Held   Signed and Held  Comprehensive metabolic panel  Daily,   R     Signed and Held   Signed and Held  CK  Daily,   R     Signed and Held          Vitals/Pain Today's Vitals   10/02/18 2300 10/02/18 2315 10/02/18 2330 10/02/18 2338  BP: 119/60 116/64 114/66   Pulse: 76 82 87   Resp: 18  16   Temp:   98.5 F (36.9 C)   TempSrc:   Oral   SpO2: 96% 97% 97%   PainSc: Asleep   0-No pain    Isolation Precautions Droplet and Contact precautions  Medications Medications  ketorolac (TORADOL) injection 30 mg (30 mg Intramuscular Given 10/02/18 1925)    Mobility walks Low fall risk   Focused Assessments Cardiac Assessment Handoff:  Cardiac Rhythm: Normal sinus rhythm No results found for: CKTOTAL, CKMB, CKMBINDEX, TROPONINI No results found for: DDIMER Does the Patient currently have chest pain? No      R Recommendations: See Admitting Provider Note  Report given to: Almedia Balls, RN  Additional Notes:

## 2018-10-02 NOTE — ED Notes (Signed)
Pt fiance would like to be called for any updates: [phone]-Michelle

## 2018-10-02 NOTE — ED Notes (Signed)
Pt hypotensive, no having dizziness

## 2018-10-02 NOTE — ED Notes (Signed)
Pt on phone with fiance- fiance updated.

## 2018-10-03 DIAGNOSIS — I959 Hypotension, unspecified: Secondary | ICD-10-CM

## 2018-10-03 DIAGNOSIS — R001 Bradycardia, unspecified: Secondary | ICD-10-CM

## 2018-10-03 DIAGNOSIS — E860 Dehydration: Secondary | ICD-10-CM

## 2018-10-03 DIAGNOSIS — U071 COVID-19: Principal | ICD-10-CM

## 2018-10-03 LAB — COMPREHENSIVE METABOLIC PANEL
ALT: 20 U/L (ref 0–44)
AST: 29 U/L (ref 15–41)
Albumin: 3.8 g/dL (ref 3.5–5.0)
Alkaline Phosphatase: 49 U/L (ref 38–126)
Anion gap: 8 (ref 5–15)
BUN: 21 mg/dL — ABNORMAL HIGH (ref 6–20)
CO2: 24 mmol/L (ref 22–32)
Calcium: 8.4 mg/dL — ABNORMAL LOW (ref 8.9–10.3)
Chloride: 105 mmol/L (ref 98–111)
Creatinine, Ser: 1.51 mg/dL — ABNORMAL HIGH (ref 0.61–1.24)
GFR calc Af Amer: >60 mL/min (ref >60)
GFR calc non Af Amer: >60 mL/min (ref >60)
Glucose, Bld: 101 mg/dL — ABNORMAL HIGH (ref 70–99)
Potassium: 4 mmol/L (ref 3.5–5.1)
Sodium: 137 mmol/L (ref 135–145)
Total Bilirubin: 1 mg/dL (ref 0.3–1.2)
Total Protein: 6.8 g/dL (ref 6.5–8.1)

## 2018-10-03 LAB — CBC WITH DIFFERENTIAL/PLATELET
Abs Immature Granulocytes: 0.01 10*3/uL (ref 0.00–0.07)
Basophils Absolute: 0 10*3/uL (ref 0.0–0.1)
Basophils Relative: 0 %
Eosinophils Absolute: 0 10*3/uL (ref 0.0–0.5)
Eosinophils Relative: 0 %
HCT: 43.3 % (ref 39.0–52.0)
Hemoglobin: 15.1 g/dL (ref 13.0–17.0)
Immature Granulocytes: 0 %
Lymphocytes Relative: 30 %
Lymphs Abs: 1.4 10*3/uL (ref 0.7–4.0)
MCH: 32.3 pg (ref 26.0–34.0)
MCHC: 34.9 g/dL (ref 30.0–36.0)
MCV: 92.7 fL (ref 80.0–100.0)
Monocytes Absolute: 0.5 10*3/uL (ref 0.1–1.0)
Monocytes Relative: 10 %
Neutro Abs: 2.9 10*3/uL (ref 1.7–7.7)
Neutrophils Relative %: 60 %
Platelets: 113 10*3/uL — ABNORMAL LOW (ref 150–400)
RBC: 4.67 MIL/uL (ref 4.22–5.81)
RDW: 13.5 % (ref 11.5–15.5)
WBC: 4.8 10*3/uL (ref 4.0–10.5)
nRBC: 0 % (ref 0.0–0.2)

## 2018-10-03 LAB — LACTATE DEHYDROGENASE: LDH: 224 U/L — ABNORMAL HIGH (ref 98–192)

## 2018-10-03 LAB — CK TOTAL AND CKMB (NOT AT ARMC)
CK, MB: 1.4 ng/mL (ref 0.5–5.0)
Relative Index: 0.3 (ref 0.0–2.5)
Total CK: 505 U/L — ABNORMAL HIGH (ref 49–397)

## 2018-10-03 LAB — C-REACTIVE PROTEIN: CRP: 1.7 mg/dL — ABNORMAL HIGH (ref <1.0)

## 2018-10-03 LAB — FERRITIN: Ferritin: 393 ng/mL — ABNORMAL HIGH (ref 24–336)

## 2018-10-03 MED ORDER — ONDANSETRON HCL 4 MG/2ML IJ SOLN: 4.0000 mg | Freq: Four times a day (QID) | INTRAMUSCULAR | Status: DC | PRN

## 2018-10-03 MED ORDER — SODIUM CHLORIDE 0.9 % IV SOLN
INTRAVENOUS | Status: DC
  Administered 2018-10-03: 01:00:00 via INTRAVENOUS

## 2018-10-03 MED ORDER — TRAMADOL HCL 50 MG PO TABS: 50.0000 mg | ORAL_TABLET | Freq: Four times a day (QID) | ORAL | Status: DC | PRN

## 2018-10-03 MED ORDER — ONDANSETRON HCL 4 MG PO TABS: 4.0000 mg | ORAL_TABLET | Freq: Four times a day (QID) | ORAL | Status: DC | PRN

## 2018-10-03 MED ORDER — GUAIFENESIN-DM 100-10 MG/5ML PO SYRP: 10.0000 mL | ORAL_SOLUTION | ORAL | Status: DC | PRN

## 2018-10-03 MED ORDER — ENOXAPARIN SODIUM 40 MG/0.4ML ~~LOC~~ SOLN: 40.0000 mg | SUBCUTANEOUS | Status: DC

## 2018-10-03 MED ORDER — ACETAMINOPHEN 325 MG PO TABS
650.0000 mg | ORAL_TABLET | Freq: Four times a day (QID) | ORAL | Status: DC | PRN
  Administered 2018-10-03 (×2): 650 mg via ORAL
  Filled 2018-10-03 (×2): qty 2

## 2018-10-03 MED ORDER — HYDROCOD POLST-CPM POLST ER 10-8 MG/5ML PO SUER: 5.0000 mL | Freq: Two times a day (BID) | ORAL | Status: DC | PRN

## 2018-10-03 NOTE — Progress Notes (Signed)
  PROGRESS NOTE    Patient: Harold Dixon                            PCP: Patient, No Pcp Per                    DOB: 1985/07/22            DOA: 10/02/2018 DPT:[id number]             DOS: 10/03/2018, 3:56 AM   LOS: 1 day   Date of Service: The patient was seen and examined on 10/03/2018  Subjective:   Arrived to [facility]. Stable,  BP improving, 110/50 not Hypoxic 97 % on RA  Brief Narrative:   33 y.o. male with medical history significant of low back pain with no known sick contacts who presented to the ER with 5 days of fever and chills and has been taking meloxicam and ibuprofen No SOB, dry non productive cough, feeling malaise and generalized weakness all week. SARs- COVID positive, Hypotensive 61/33  . Tmax 102.8, P 1.2, RR 18 O2 sat 94%,.  S/P IVF, vitals are improving.    Principal Problem:   COVID-19 virus infection Active Problems:   Hypotension   Dehydration   Bradycardia   ARF (acute renal failure) (HCC)  Antimicrobials:  Anti-infectives (From admission, onward)   None     Medication:  . enoxaparin (LOVENOX) injection  40 mg Subcutaneous Q24H    acetaminophen, chlorpheniramine-HYDROcodone, guaiFENesin-dextromethorphan, ondansetron **OR** ondansetron (ZOFRAN) IV, traMADol     Objective:   Vitals:   10/02/18 2300 10/02/18 2315 10/02/18 2330 10/03/18 0136  BP: 119/60 116/64 114/66 (!) 110/50  Pulse: 76 82 87 89  Resp: 18  16 18   Temp:   98.5 F (36.9 C) 98.9 F (37.2 C)  TempSrc:   Oral Oral  SpO2: 96% 97% 97%    No intake or output data in the 24 hours ending 10/03/18 0356 There were no vitals filed for this visit.   Examination:   Physical Exam  Constitution:  Alert, cooperative, no distress,  Appears calm and comfortable, On RA LABs:  CBC Latest Ref Rng & Units 10/02/2018  WBC 4.0 - 10.5 K/uL 6.8  Hemoglobin 13.0 - 17.0 g/dL 34.3  Hematocrit 73.5 - 52.0 % 46.7  Platelets 150 - 400 K/uL 119(L)   CMP Latest Ref Rng & Units 10/02/2018   Glucose 70 - 99 mg/dL 789(B)  BUN 6 - 20 mg/dL 14  Creatinine 8.47 - 8.41 mg/dL 2.82(K)  Sodium 813 - 887 mmol/L 138  Potassium 3.5 - 5.1 mmol/L 4.5  Chloride 98 - 111 mmol/L 99  CO2 22 - 32 mmol/L 27  Calcium 8.9 - 10.3 mg/dL 9.7        SIGNED: Kendell Bane, MD, FACP, FHM. [facility],  Pager [phone]  If 7PM-7AM, please contact night-coverage [email or web address] 10/03/2018, 3:56 AM

## 2018-10-03 NOTE — Progress Notes (Deleted)
Physician Discharge Summary  Harold Dixon WUJ:[id number] DOB: 1985-12-12 DOA: 10/02/2018  PCP: Patient, No Pcp Per  Admit date: 10/02/2018 Discharge date: 10/03/2018  Admitted From: Home Disposition: Home  Recommendations for Outpatient Follow-up:  1. Follow up with PCP in 1-2 weeks 2. Please obtain BMP/CBC in one week 3. Patient has been advised to continue isolation at home per state Board of Health guidelines 4. He has been advised to return to the hospital if he has any worsening shortness of breath, near syncope  Discharge Condition: Stable CODE STATUS: Full code Diet recommendation: Regular diet  Brief/Interim Summary: This is a 33 year old male with no significant past medical history, presents to the emergency room with a 5-day history of fever and chills.  He has been taking meloxicam and ibuprofen.  He was noted to be hypotensive on arrival with a blood pressure in the 60s.  He also had some bradycardia with a heart rate in the 40s.  He was febrile with temperature 102.8.  Creatinine was noted to be elevated at 1.7.  The patient tested positive for COVID-19.  He was transferred to [facility] for further management.  After receiving IV fluids, his blood pressure and heart rate have stabilized.  Heart rate has remained in the 80s to 90s.  He has not had any fever spikes since arriving to [facility].  The patient is feeling substantially better.  He is able to ambulate on room air without any shortness of breath or hypoxia.  He is not dizzy.  He is eating and drinking normally.  Inflammatory markers are mildly elevated.  He is insistent that he will continue oral hydration at home and wishes to return home today.  He has been advised to continue aggressive oral hydration and use Tylenol as needed for fever.  He has been advised to stop using any further NSAIDs.  He is also been advised to return to the hospital if he has any worsening shortness of breath, dizziness or  lightheadedness.  Discharge Diagnoses:  Principal Problem:   COVID-19 virus infection Active Problems:   Hypotension   Dehydration   Bradycardia   ARF (acute renal failure) (HCC)    Discharge Instructions  Discharge Instructions    Diet - low sodium heart healthy   Complete by:  As directed    Increase activity slowly   Complete by:  As directed    MyChart COVID-19 home monitoring program   Complete by:  Oct 03, 2018    Is the patient willing to use the MyChart Mobile App for home monitoring?:  Yes   Temperature monitoring   Complete by:  Oct 03, 2018    After how many days would you like to receive a notification of this patient's flowsheet entries?:  1     Allergies as of 10/03/2018   No Known Allergies     Medication List    TAKE these medications   benzonatate 100 MG capsule Commonly known as:  TESSALON Take 1 capsule (100 mg total) by mouth every 8 (eight) hours.      Follow-up Information    [facility] Follow up on 10/11/2018.   Why:  Hospital follow up appointment- this will be a telephone visit- office will call at time of appointment- 1:50pm Contact information: [address] [facility] [address] [phone]         No Known Allergies  Consultations:     Procedures/Studies: Dg Chest Portable 1  View  Result Date: 10/02/2018 CLINICAL DATA:  Fever and nausea for 5 days. EXAM: PORTABLE CHEST 1 VIEW COMPARISON:  None. FINDINGS: The cardiac silhouette, mediastinal and hilar contours are within normal limits given the AP projection and portable technique. Low lung volumes with vascular crowding and streaky basilar atelectasis. No definite infiltrates or effusions. The bony thorax is intact. IMPRESSION: No acute cardiopulmonary findings. Electronically Signed   By: Rudie MeyerP.  Gallerani M.D.   On: 10/02/2018 20:10       Subjective: Patient is feeling significantly better.  Denies any dizziness or  lightheadedness.  No shortness of breath or cough.  Feeling like he is back to normal.  Wants to go home.  Discharge Exam: Vitals:   10/03/18 0136 10/03/18 0912 10/03/18 1258 10/03/18 1635  BP: (!) 110/50  136/75 (!) 141/93  Pulse: 89     Resp: 18     Temp: 98.9 F (37.2 C) 100 F (37.8 C) 98.8 F (37.1 C) 99 F (37.2 C)  TempSrc: Oral Oral Oral Oral  SpO2:        General: Pt is alert, awake, not in acute distress Cardiovascular: RRR, S1/S2 +, no rubs, no gallops Respiratory: CTA bilaterally, no wheezing, no rhonchi Abdominal: Soft, NT, ND, bowel sounds + Extremities: no edema, no cyanosis    The results of significant diagnostics from this hospitalization (including imaging, microbiology, ancillary and laboratory) are listed below for reference.     Microbiology: Recent Results (from the past 240 hour(s))  SARS Coronavirus 2 (CEPHEID- Performed [facility]), Hosp Order     Status: Abnormal   Collection Time: 10/02/18  7:22 PM  Result Value Ref Range Status   SARS Coronavirus 2 POSITIVE (A) NEGATIVE Final    Comment: RESULT CALLED TO, READ BACK BY AND VERIFIED WITH: E PREYER RN 10/02/18 2101 JDW (NOTE) If result is NEGATIVE SARS-CoV-2 target nucleic acids are NOT DETECTED. The SARS-CoV-2 RNA is generally detectable in upper and lower  respiratory specimens during the acute phase of infection. The lowest  concentration of SARS-CoV-2 viral copies this assay can detect is 250  copies / mL. A negative result does not preclude SARS-CoV-2 infection  and should not be used as the sole basis for treatment or other  patient management decisions.  A negative result may occur with  improper specimen collection / handling, submission of specimen other  than nasopharyngeal swab, presence of viral mutation(s) within the  areas targeted by this assay, and inadequate number of viral copies  (<250 copies / mL). A negative result must be combined with clinical   observations, patient history, and epidemiological information. If result is POSITIVE SARS-CoV-2 target nucleic acids are DETECTED. The SARS- CoV-2 RNA is generally detectable in upper and lower  respiratory specimens during the acute phase of infection.  Positive  results are indicative of active infection with SARS-CoV-2.  Clinical  correlation with patient history and other diagnostic information is  necessary to determine patient infection status.  Positive results do  not rule out bacterial infection or co-infection with other viruses. If result is PRESUMPTIVE POSTIVE SARS-CoV-2 nucleic acids MAY BE PRESENT.   A presumptive positive result was obtained on the submitted specimen  and confirmed on repeat testing.  While 2019 novel coronavirus  (SARS-CoV-2) nucleic acids may be present in the submitted sample  additional confirmatory testing may be necessary for epidemiological  and / or clinical management purposes  to differentiate between  SARS-CoV-2 and other Sarbecovirus currently known to  infect humans.  If clinically indicated additional testing with an alternate test  methodology [phone]) is advised . The SARS-CoV-2 RNA is generally  detectable in upper and lower respiratory specimens during the acute  phase of infection. The expected result is Negative. Fact Sheet for Patients:  BoilerBrush.com.cy Fact Sheet for Healthcare Providers: https://pope.com/ This test is not yet approved or cleared by the [place] FDA and has been authorized for detection and/or diagnosis of SARS-CoV-2 by FDA under an Emergency Use Authorization (EUA).  This EUA will remain in effect (meaning this test can be used) for the duration of the COVID-19 declaration under Section 564(b)(1) of the Act, 21 U.S.C. section 360bbb-3(b)(1), unless the authorization is terminated or revoked sooner. Performed at [facility], [address],  [place], [place] [address]      Labs: BNP (last 3 results) No results for input(s): BNP in the last 8760 hours. Basic Metabolic Panel: Recent Labs  Lab 10/02/18 1912 10/03/18 0500  NA 138 137  K 4.5 4.0  CL 99 105  CO2 27 24  GLUCOSE 119* 101*  BUN 14 21*  CREATININE 1.73* 1.51*  CALCIUM 9.7 8.4*   Liver Function Tests: Recent Labs  Lab 10/03/18 0500  AST 29  ALT 20  ALKPHOS 49  BILITOT 1.0  PROT 6.8  ALBUMIN 3.8   No results for input(s): LIPASE, AMYLASE in the last 168 hours. No results for input(s): AMMONIA in the last 168 hours. CBC: Recent Labs  Lab 10/02/18 1912 10/03/18 0500  WBC 6.8 4.8  NEUTROABS 5.5 2.9  HGB 16.2 15.1  HCT 46.7 43.3  MCV 89.8 92.7  PLT 119* 113*   Cardiac Enzymes: Recent Labs  Lab 10/03/18 0500  CKTOTAL 505*  CKMB 1.4   BNP: Invalid input(s): POCBNP CBG: No results for input(s): GLUCAP in the last 168 hours. D-Dimer No results for input(s): DDIMER in the last 72 hours. Hgb A1c No results for input(s): HGBA1C in the last 72 hours. Lipid Profile No results for input(s): CHOL, HDL, LDLCALC, TRIG, CHOLHDL, LDLDIRECT in the last 72 hours. Thyroid function studies No results for input(s): TSH, T4TOTAL, T3FREE, THYROIDAB in the last 72 hours.  Invalid input(s): FREET3 Anemia work up [employer]    10/03/18 0500  FERRITIN 393*   Urinalysis No results found for: COLORURINE, APPEARANCEUR, LABSPEC, PHURINE, GLUCOSEU, HGBUR, BILIRUBINUR, KETONESUR, PROTEINUR, UROBILINOGEN, NITRITE, LEUKOCYTESUR Sepsis Labs Invalid input(s): PROCALCITONIN,  WBC,  LACTICIDVEN Microbiology Recent Results (from the past 240 hour(s))  SARS Coronavirus 2 (CEPHEID- Performed in [facility] hospital lab), Hosp Order     Status: Abnormal   Collection Time: 10/02/18  7:22 PM  Result Value Ref Range Status   SARS Coronavirus 2 POSITIVE (A) NEGATIVE Final    Comment: RESULT CALLED TO, READ BACK BY AND VERIFIED WITH: E PREYER RN 10/02/18 2101  JDW (NOTE) If result is NEGATIVE SARS-CoV-2 target nucleic acids are NOT DETECTED. The SARS-CoV-2 RNA is generally detectable in upper and lower  respiratory specimens during the acute phase of infection. The lowest  concentration of SARS-CoV-2 viral copies this assay can detect is 250  copies / mL. A negative result does not preclude SARS-CoV-2 infection  and should not be used as the sole basis for treatment or other  patient management decisions.  A negative result may occur with  improper specimen collection / handling, submission of specimen other  than nasopharyngeal swab, presence of viral mutation(s) within the  areas targeted by this assay, and inadequate  number of viral copies  (<250 copies / mL). A negative result must be combined with clinical  observations, patient history, and epidemiological information. If result is POSITIVE SARS-CoV-2 target nucleic acids are DETECTED. The SARS- CoV-2 RNA is generally detectable in upper and lower  respiratory specimens during the acute phase of infection.  Positive  results are indicative of active infection with SARS-CoV-2.  Clinical  correlation with patient history and other diagnostic information is  necessary to determine patient infection status.  Positive results do  not rule out bacterial infection or co-infection with other viruses. If result is PRESUMPTIVE POSTIVE SARS-CoV-2 nucleic acids MAY BE PRESENT.   A presumptive positive result was obtained on the submitted specimen  and confirmed on repeat testing.  While 2019 novel coronavirus  (SARS-CoV-2) nucleic acids may be present in the submitted sample  additional confirmatory testing may be necessary for epidemiological  and / or clinical management purposes  to differentiate between  SARS-CoV-2 and other Sarbecovirus currently known to infect humans.  If clinically indicated additional testing with an alternate test  methodology [phone]) is advised . The SARS-CoV-2 RNA  is generally  detectable in upper and lower respiratory specimens during the acute  phase of infection. The expected result is Negative. Fact Sheet for Patients:  BoilerBrush.com.cy Fact Sheet for Healthcare Providers: https://pope.com/ This test is not yet approved or cleared by the [place] FDA and has been authorized for detection and/or diagnosis of SARS-CoV-2 by FDA under an Emergency Use Authorization (EUA).  This EUA will remain in effect (meaning this test can be used) for the duration of the COVID-19 declaration under Section 564(b)(1) of the Act, 21 U.S.C. section 360bbb-3(b)(1), unless the authorization is terminated or revoked sooner. Performed at [facility], [address] [place], [place] [address]      Time coordinating discharge:  SIGNED:   Erick Blinks, MD  [facility] 10/03/2018, 5:15 PM   If 7PM-7AM, please contact night-coverage [email or web address]

## 2018-10-04 NOTE — Discharge Summary (Signed)
Physician Discharge Summary  Harold Dixon ZOX:[id number] DOB: [birth year] DOA: 10/02/2018  PCP: Patient, No Pcp Per  Admit date: 10/02/2018 Discharge date: 10/03/2018  Admitted From: Home Disposition: Home  Recommendations for Outpatient Follow-up:  1. Follow up with PCP in 1-2 weeks 2. Please obtain BMP/CBC in one week 3. Patient has been advised to continue isolation at home per state Board of Health guidelines 4. He has been advised to return to the hospital if he has any worsening shortness of breath, near syncope  Discharge Condition: Stable CODE STATUS: Full code Diet recommendation: Regular diet  Brief/Interim Summary: This is a 33 year old male with no significant past medical history, presents to the emergency room with a 5-day history of fever and chills.  He has been taking meloxicam and ibuprofen.  He was noted to be hypotensive on arrival with a blood pressure in the 60s.  He also had some bradycardia with a heart rate in the 40s.  He was febrile with temperature 102.8.  Creatinine was noted to be elevated at 1.7.  The patient tested positive for COVID-19.  He was transferred to [facility] for further management.  After receiving IV fluids, his blood pressure and heart rate have stabilized.  Heart rate has remained in the 80s to 90s.  He has not had any fever spikes since arriving to [facility].  The patient is feeling substantially better.  He is able to ambulate on room air without any shortness of breath or hypoxia.  He is not dizzy.  He is eating and drinking normally.  Inflammatory markers are mildly elevated.  He is insistent that he will continue oral hydration at home and wishes to return home today.  He has been advised to continue aggressive oral hydration and use Tylenol as needed for fever.  He has been advised to stop using any further NSAIDs.  He is also been advised to return to the hospital if he has any worsening shortness of breath, dizziness or  lightheadedness.  Discharge Diagnoses:  Principal Problem:   COVID-19 virus infection Active Problems:   Hypotension   Dehydration   Bradycardia   ARF (acute renal failure) [facility])    Discharge Instructions  Discharge Instructions    MyChart COVID-19 home monitoring program   Complete by:  Oct 03, 2018    Is the patient willing to use the MyChart Mobile App for home monitoring?:  Yes   Temperature monitoring   Complete by:  Oct 03, 2018    After how many days would you like to receive a notification of this patient's flowsheet entries?:  1   Diet - low sodium heart healthy   Complete by:  As directed    Increase activity slowly   Complete by:  As directed      Allergies as of 10/03/2018   No Known Allergies     Medication List    TAKE these medications   benzonatate 100 MG capsule Commonly known as:  TESSALON Take 1 capsule (100 mg total) by mouth every 8 (eight) hours.      Follow-up Information    [facility] Follow up on 10/11/2018.   Why:  Hospital follow up appointment- this will be a telephone visit- office will call at time of appointment- 1:50pm Contact information: [address] [facility] [address] [phone]         No Known Allergies  Consultations:     Procedures/Studies: Dg Chest Portable 1  View  Result Date: 10/02/2018 CLINICAL DATA:  Fever and nausea for 5 days. EXAM: PORTABLE CHEST 1 VIEW COMPARISON:  None. FINDINGS: The cardiac silhouette, mediastinal and hilar contours are within normal limits given the AP projection and portable technique. Low lung volumes with vascular crowding and streaky basilar atelectasis. No definite infiltrates or effusions. The bony thorax is intact. IMPRESSION: No acute cardiopulmonary findings. Electronically Signed   By: Rudie Meyer M.D.   On: 10/02/2018 20:10      Subjective: Patient is feeling significantly better.  Denies any dizziness or  lightheadedness.  No shortness of breath or cough.  Feeling like he is back to normal.  Wants to go home.  Discharge Exam: Vitals:   10/03/18 0136 10/03/18 0912 10/03/18 1258 10/03/18 1635  BP: (!) 110/50  136/75 (!) 141/93  Pulse: 89     Resp: 18     Temp: 98.9 F (37.2 C) 100 F (37.8 C) 98.8 F (37.1 C) 99 F (37.2 C)  TempSrc: Oral Oral Oral Oral  SpO2:        General: Pt is alert, awake, not in acute distress Cardiovascular: RRR, S1/S2 +, no rubs, no gallops Respiratory: CTA bilaterally, no wheezing, no rhonchi Abdominal: Soft, NT, ND, bowel sounds + Extremities: no edema, no cyanosis    The results of significant diagnostics from this hospitalization (including imaging, microbiology, ancillary and laboratory) are listed below for reference.     Microbiology: Recent Results (from the past 240 hour(s))  SARS Coronavirus 2 (CEPHEID- Performed in [facility] hospital lab), Hosp Order     Status: Abnormal   Collection Time: 10/02/18  7:22 PM  Result Value Ref Range Status   SARS Coronavirus 2 POSITIVE (A) NEGATIVE Final    Comment: RESULT CALLED TO, READ BACK BY AND VERIFIED WITH: E PREYER RN 10/02/18 2101 JDW (NOTE) If result is NEGATIVE SARS-CoV-2 target nucleic acids are NOT DETECTED. The SARS-CoV-2 RNA is generally detectable in upper and lower  respiratory specimens during the acute phase of infection. The lowest  concentration of SARS-CoV-2 viral copies this assay can detect is 250  copies / mL. A negative result does not preclude SARS-CoV-2 infection  and should not be used as the sole basis for treatment or other  patient management decisions.  A negative result may occur with  improper specimen collection / handling, submission of specimen other  than nasopharyngeal swab, presence of viral mutation(s) within the  areas targeted by this assay, and inadequate number of viral copies  (<250 copies / mL). A negative result must be combined with clinical   observations, patient history, and epidemiological information. If result is POSITIVE SARS-CoV-2 target nucleic acids are DETECTED. The SARS- CoV-2 RNA is generally detectable in upper and lower  respiratory specimens during the acute phase of infection.  Positive  results are indicative of active infection with SARS-CoV-2.  Clinical  correlation with patient history and other diagnostic information is  necessary to determine patient infection status.  Positive results do  not rule out bacterial infection or co-infection with other viruses. If result is PRESUMPTIVE POSTIVE SARS-CoV-2 nucleic acids MAY BE PRESENT.   A presumptive positive result was obtained on the submitted specimen  and confirmed on repeat testing.  While 2019 novel coronavirus  (SARS-CoV-2) nucleic acids may be present in the submitted sample  additional confirmatory testing may be necessary for epidemiological  and / or clinical management purposes  to differentiate between  SARS-CoV-2 and other Sarbecovirus currently known to infect  humans.  If clinically indicated additional testing with an alternate test  methodology [phone]) is advised . The SARS-CoV-2 RNA is generally  detectable in upper and lower respiratory specimens during the acute  phase of infection. The expected result is Negative. Fact Sheet for Patients:  BoilerBrush.com.cyhttps://www.fda.gov/media/136312/download Fact Sheet for Healthcare Providers: https://pope.com/https://www.fda.gov/media/136313/download This test is not yet approved or cleared by the [place] FDA and has been authorized for detection and/or diagnosis of SARS-CoV-2 by FDA under an Emergency Use Authorization (EUA).  This EUA will remain in effect (meaning this test can be used) for the duration of the COVID-19 declaration under Section 564(b)(1) of the Act, 21 U.S.C. section 360bbb-3(b)(1), unless the authorization is terminated or revoked sooner. Performed at [facility], [address]  [place], [place] [id number]      Labs: BNP (last 3 results) No results for input(s): BNP in the last 8760 hours. Basic Metabolic Panel: Recent Labs  Lab 10/02/18 1912 10/03/18 0500  NA 138 137  K 4.5 4.0  CL 99 105  CO2 27 24  GLUCOSE 119* 101*  BUN 14 21*  CREATININE 1.73* 1.51*  CALCIUM 9.7 8.4*   Liver Function Tests: Recent Labs  Lab 10/03/18 0500  AST 29  ALT 20  ALKPHOS 49  BILITOT 1.0  PROT 6.8  ALBUMIN 3.8   No results for input(s): LIPASE, AMYLASE in the last 168 hours. No results for input(s): AMMONIA in the last 168 hours. CBC: Recent Labs  Lab 10/02/18 1912 10/03/18 0500  WBC 6.8 4.8  NEUTROABS 5.5 2.9  HGB 16.2 15.1  HCT 46.7 43.3  MCV 89.8 92.7  PLT 119* 113*   Cardiac Enzymes: Recent Labs  Lab 10/03/18 0500  CKTOTAL 505*  CKMB 1.4   BNP: Invalid input(s): POCBNP CBG: No results for input(s): GLUCAP in the last 168 hours. D-Dimer No results for input(s): DDIMER in the last 72 hours. Hgb A1c No results for input(s): HGBA1C in the last 72 hours. Lipid Profile No results for input(s): CHOL, HDL, LDLCALC, TRIG, CHOLHDL, LDLDIRECT in the last 72 hours. Thyroid function studies No results for input(s): TSH, T4TOTAL, T3FREE, THYROIDAB in the last 72 hours.  Invalid input(s): FREET3 Anemia work up [facility]    10/03/18 0500  FERRITIN 393*   Urinalysis No results found for: COLORURINE, APPEARANCEUR, LABSPEC, PHURINE, GLUCOSEU, HGBUR, BILIRUBINUR, KETONESUR, PROTEINUR, UROBILINOGEN, NITRITE, LEUKOCYTESUR Sepsis Labs Invalid input(s): PROCALCITONIN,  WBC,  LACTICIDVEN Microbiology Recent Results (from the past 240 hour(s))  SARS Coronavirus 2 (CEPHEID- Performed in [facility] hospital lab), Hosp Order     Status: Abnormal   Collection Time: 10/02/18  7:22 PM  Result Value Ref Range Status   SARS Coronavirus 2 POSITIVE (A) NEGATIVE Final    Comment: RESULT CALLED TO, READ BACK BY AND VERIFIED WITH: E PREYER RN 10/02/18 2101  JDW (NOTE) If result is NEGATIVE SARS-CoV-2 target nucleic acids are NOT DETECTED. The SARS-CoV-2 RNA is generally detectable in upper and lower  respiratory specimens during the acute phase of infection. The lowest  concentration of SARS-CoV-2 viral copies this assay can detect is 250  copies / mL. A negative result does not preclude SARS-CoV-2 infection  and should not be used as the sole basis for treatment or other  patient management decisions.  A negative result may occur with  improper specimen collection / handling, submission of specimen other  than nasopharyngeal swab, presence of viral mutation(s) within the  areas targeted by this assay, and inadequate number  of viral copies  (<250 copies / mL). A negative result must be combined with clinical  observations, patient history, and epidemiological information. If result is POSITIVE SARS-CoV-2 target nucleic acids are DETECTED. The SARS- CoV-2 RNA is generally detectable in upper and lower  respiratory specimens during the acute phase of infection.  Positive  results are indicative of active infection with SARS-CoV-2.  Clinical  correlation with patient history and other diagnostic information is  necessary to determine patient infection status.  Positive results do  not rule out bacterial infection or co-infection with other viruses. If result is PRESUMPTIVE POSTIVE SARS-CoV-2 nucleic acids MAY BE PRESENT.   A presumptive positive result was obtained on the submitted specimen  and confirmed on repeat testing.  While 2019 novel coronavirus  (SARS-CoV-2) nucleic acids may be present in the submitted sample  additional confirmatory testing may be necessary for epidemiological  and / or clinical management purposes  to differentiate between  SARS-CoV-2 and other Sarbecovirus currently known to infect humans.  If clinically indicated additional testing with an alternate test  methodology [phone]) is advised . The SARS-CoV-2 RNA  is generally  detectable in upper and lower respiratory specimens during the acute  phase of infection. The expected result is Negative. Fact Sheet for Patients:  BoilerBrush.com.cy Fact Sheet for Healthcare Providers: https://pope.com/ This test is not yet approved or cleared by the [place] FDA and has been authorized for detection and/or diagnosis of SARS-CoV-2 by FDA under an Emergency Use Authorization (EUA).  This EUA will remain in effect (meaning this test can be used) for the duration of the COVID-19 declaration under Section 564(b)(1) of the Act, 21 U.S.C. section 360bbb-3(b)(1), unless the authorization is terminated or revoked sooner. Performed at [facility], [address] [place], [place] [address]      Time coordinating discharge:  SIGNED:   Erick Blinks, MD  [facility] 10/04/2018, 7:34 PM   If 7PM-7AM, please contact night-coverage [email or web address]

## 2018-10-11 ENCOUNTER — Encounter: Payer: Self-pay | Admitting: Primary Care

## 2018-10-11 ENCOUNTER — Other Ambulatory Visit: Payer: Self-pay

## 2018-10-11 ENCOUNTER — Ambulatory Visit: Payer: HRSA Program | Attending: Primary Care | Admitting: Primary Care

## 2018-10-11 DIAGNOSIS — Z09 Encounter for follow-up examination after completed treatment for conditions other than malignant neoplasm: Secondary | ICD-10-CM | POA: Diagnosis not present

## 2018-10-11 DIAGNOSIS — U071 COVID-19: Secondary | ICD-10-CM

## 2018-10-11 DIAGNOSIS — G44001 Cluster headache syndrome, unspecified, intractable: Secondary | ICD-10-CM | POA: Diagnosis not present

## 2018-10-11 DIAGNOSIS — R519 Headache, unspecified: Secondary | ICD-10-CM | POA: Insufficient documentation

## 2018-10-11 NOTE — Progress Notes (Signed)
Patient verified DOB Patient has not taken medication today. Patient has not eaten today. Patient denies pain at this time. Patient denies cough/N/V/  Last fever was 10/06/2018 patient has been using the Mychart monitoring.

## 2018-10-11 NOTE — Progress Notes (Signed)
Virtual Visit via Telephone Note  I connected with Harold Dixon on 10/11/18 at  1:50 PM EDT by telephone and verified that I am speaking with the correct person using two identifiers.   I discussed the limitations, risks, security and privacy concerns of performing an evaluation and management service by telephone and the availability of in person appointments. I also discussed with the patient that there may be a patient responsible charge related to this service. The patient expressed understanding and agreed to proceed.   History of Present Illness: [name] presented to ED after a week of fever and chills ( felt like he had the flu.) Admitted for observation for hypotension,bradycardia and was tx with fluids and a temp of 102.8 tylenol.   Observations/Objective: Review of Systems  Constitutional: Positive for malaise/fatigue.  HENT: Negative.   Eyes: Negative.   Respiratory: Positive for cough.   Cardiovascular: Negative.   Gastrointestinal: Positive for diarrhea.  Genitourinary: Negative.   Musculoskeletal: Positive for myalgias.  Skin: Negative.   Neurological: Positive for weakness and headaches.  Endo/Heme/Allergies: Negative.   Psychiatric/Behavioral: Negative.     Assessment and Plan: Harold Dixon was seen today for hospitalization follow-up.  Diagnoses and all orders for this visit:  COVID-19 virus infection Presented to the ED after 5 days of fever, chills, non productive cough  generalized weakness and hypotensive. In ED temp was 102.8 , O2 94% on room air. Test and results were +COVID. Transferred to [facility].  Hospital discharge follow-up Instructed to f/u with PCP 1-2 weeks (establish care) recc labs in 1 week CBC/BMP and continue self quarentine  per CDC. Advise if s/s became worse to rtn to hosp..Patient remains fatigue and a dry cough remaining at home as instructed .  Intractable cluster headache syndrome, unspecified chronicity  pattern This is not a new problem has managed this with NSAID's. Unable to identify triggers. Next appt. Will re-eval . Ask pt to start a H/A log when starting to feel better and bring it in on f/u    Follow Up Instructions:    I discussed the assessment and treatment plan with the patient. The patient was provided an opportunity to ask questions and all were answered. The patient agreed with the plan and demonstrated an understanding of the instructions.   The patient was advised to call back or seek an in-person evaluation if the symptoms worsen or if the condition fails to improve as anticipated.  I provided  30 minutes of non-face-to-face time during this encounter. This included review of chat , labs and imaging   Kerin Perna, NP

## 2018-10-12 ENCOUNTER — Encounter: Payer: Self-pay | Admitting: Primary Care

## 2019-11-10 IMAGING — CR PORTABLE CHEST - 1 VIEW
1 series · 1 of 1 positions shown · non-contrast
Comparison: None.

CLINICAL DATA: Fever and nausea for 5 days.

EXAM:
PORTABLE CHEST 1 VIEW

[AP]
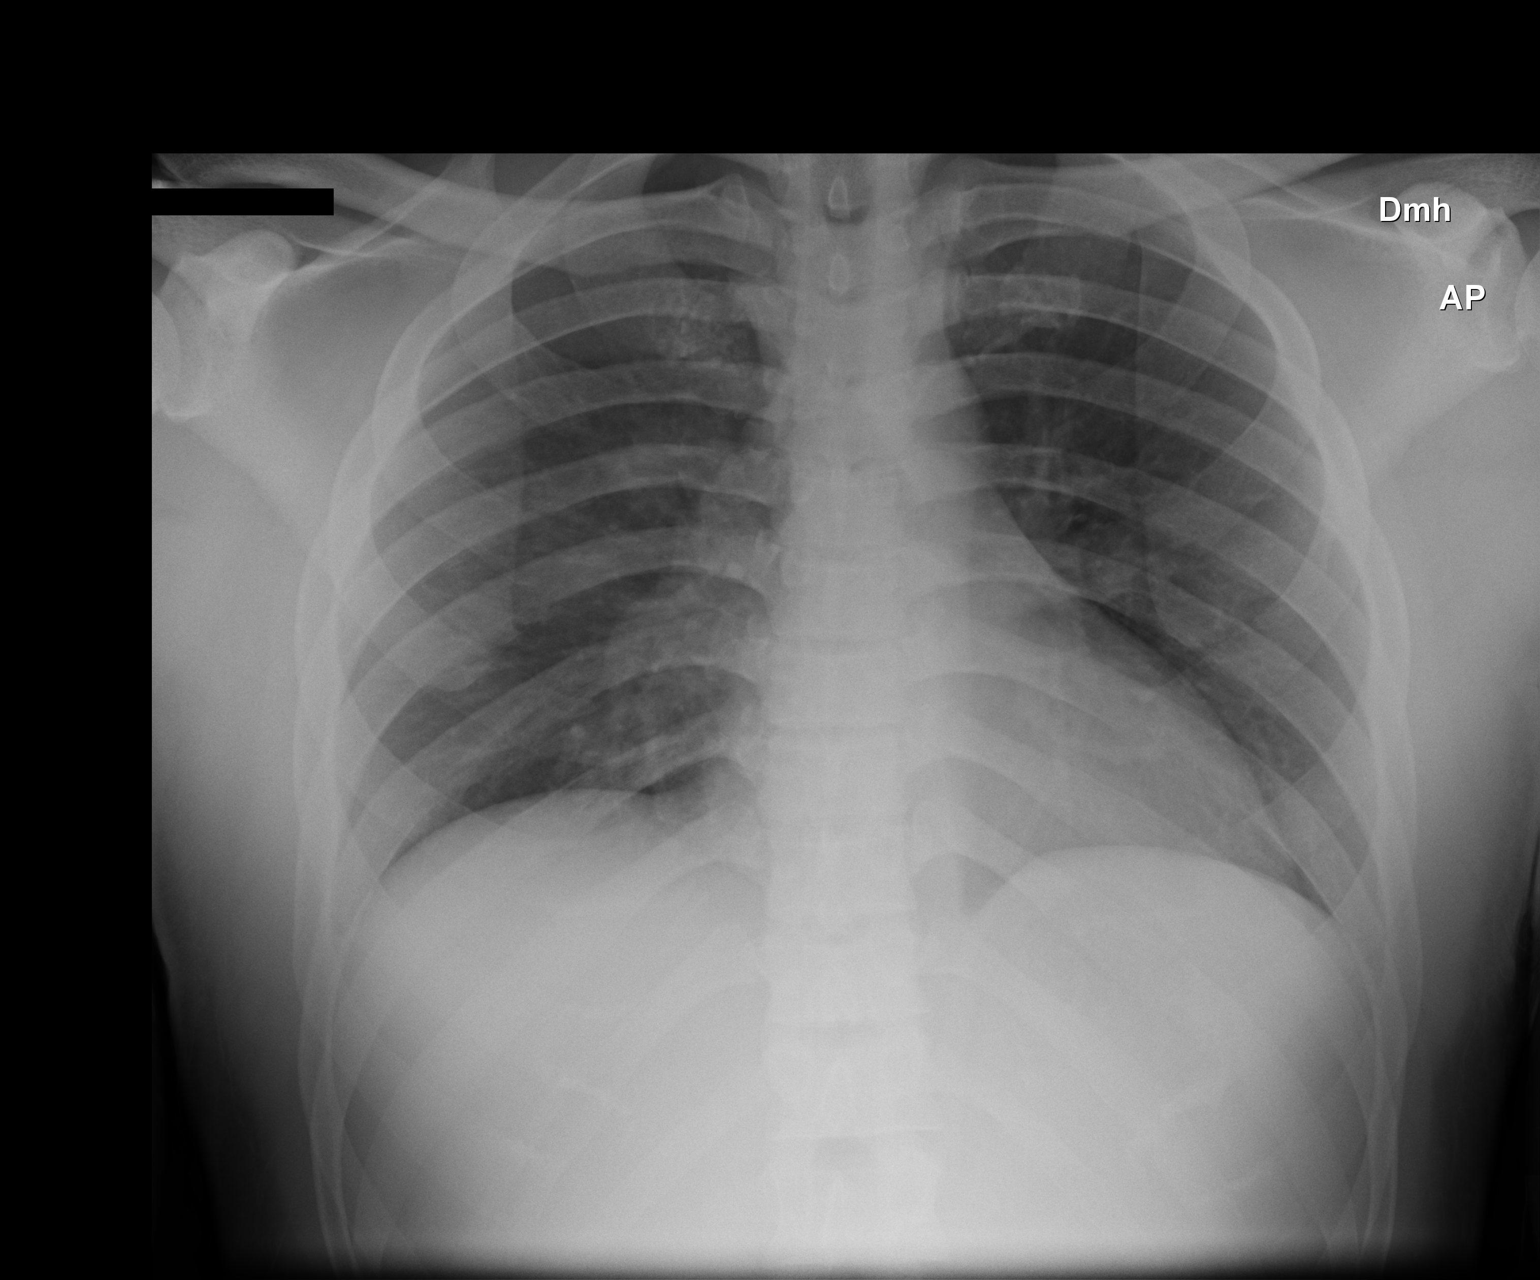

[1 of 1 positions shown; findings below may reference images not displayed]

FINDINGS: The cardiac silhouette, mediastinal and hilar contours are within
normal limits given the AP projection and portable technique. Low
lung volumes with vascular crowding and streaky basilar atelectasis.
No definite infiltrates or effusions. The bony thorax is intact.
IMPRESSION: No acute cardiopulmonary findings.
